# Patient Record
Sex: Male | Born: 1960 | Race: White | Hispanic: No | Marital: Married | Smoking: Never smoker
Health system: Southern US, Community
[De-identification: ages and names within clinical notes are randomized; demographics above are authoritative.]

## PROBLEM LIST (undated history)

## (undated) DIAGNOSIS — M1990 Unspecified osteoarthritis, unspecified site: Secondary | ICD-10-CM

## (undated) DIAGNOSIS — Z973 Presence of spectacles and contact lenses: Secondary | ICD-10-CM

## (undated) DIAGNOSIS — T8859XA Other complications of anesthesia, initial encounter: Secondary | ICD-10-CM

## (undated) DIAGNOSIS — T4145XA Adverse effect of unspecified anesthetic, initial encounter: Secondary | ICD-10-CM

## (undated) DIAGNOSIS — Z87442 Personal history of urinary calculi: Secondary | ICD-10-CM

## (undated) HISTORY — PX: CARPAL TUNNEL RELEASE: SHX101

## (undated) HISTORY — PX: WISDOM TOOTH EXTRACTION: SHX21

## (undated) HISTORY — PX: TOTAL HIP ARTHROPLASTY: SHX124

---

## 2015-01-22 ENCOUNTER — Ambulatory Visit: Payer: Self-pay | Admitting: Orthopedic Surgery

## 2015-01-30 ENCOUNTER — Ambulatory Visit: Payer: Self-pay | Admitting: Orthopedic Surgery

## 2015-01-30 NOTE — H&P (Signed)
TOTAL HIP REVISION ADMISSION H&P  Patient is admitted for right revision total hip arthroplasty.  Subjective:  Chief Complaint: right hip pain  HPI: Larry Brewer, 54 y.o. male, has a history of pain and functional disability in the right hip due to failed right total hip replacement.  The indications for the revision total hip arthroplasty are fracture or mechanical failure of one or more component.  Onset of symptoms was abrupt starting 1 years ago with rapidlly worsening course since that time.  Prior procedures on the right hip include arthroplasty.  Patient currently rates pain in the right hip at 10 out of 10 with activity.  This condition presents safety issues increasing the risk of falls. There is no current active infection.  There are no active problems to display for this patient.  No past medical history on file.  No past surgical history on file.   (Not in a hospital admission) Allergies  Allergen Reactions  . Etodolac Other (See Comments)    Visual interaction. "wierdest eye distortion"  . Avelox [Moxifloxacin Hcl In Nacl] Rash    Across chest.    Social History  Substance Use Topics  . Smoking status: Not on file  . Smokeless tobacco: Not on file  . Alcohol Use: Not on file    No family history on file.    Review of Systems  Constitutional: Negative.   HENT: Positive for tinnitus.   Eyes: Negative.   Respiratory: Negative.   Cardiovascular: Negative.   Gastrointestinal: Negative.   Genitourinary: Negative.   Musculoskeletal: Negative.   Skin: Negative.   Neurological: Negative.   Endo/Heme/Allergies: Negative.   Psychiatric/Behavioral: Negative.     Objective:  Physical Exam  Vitals reviewed. Constitutional: He is oriented to person, place, and time. He appears well-developed and well-nourished.  HENT:  Head: Normocephalic.  Eyes: Conjunctivae and EOM are normal. Pupils are equal, round, and reactive to light.  Neck: Normal range of motion. Neck  supple.  Cardiovascular: Normal rate and regular rhythm.   Respiratory: Effort normal and breath sounds normal. No respiratory distress.  GI: Soft. Bowel sounds are normal. He exhibits no distension.  Genitourinary:  deferred  Musculoskeletal:       Right hip: He exhibits decreased range of motion and crepitus.  Neurological: He is alert and oriented to person, place, and time. He has normal reflexes.  Skin: Skin is warm and dry.  Psychiatric: He has a normal mood and affect. His behavior is normal. Judgment and thought content normal.    Vital signs in last 24 hours: @VSRANGES@   Labs:   There is no height or weight on file to calculate BMI.  Imaging Review:  Plain radiographs demonstrate fractured ceramic liner. The bone quality appears to be adequate for age and reported activity level.  Assessment/Plan:  End stage arthritis, right hip(s) with failed previous arthroplasty.  The patient history, physical examination, clinical judgement of the provider and imaging studies are consistent with end stage degenerative joint disease of the right hip(s), previous total hip arthroplasty. Revision total hip arthroplasty is deemed medically necessary. The treatment options including medical management, injection therapy, arthroscopy and arthroplasty were discussed at length. The risks and benefits of total hip arthroplasty were presented and reviewed. The risks due to aseptic loosening, infection, stiffness, dislocation/subluxation,  thromboembolic complications and other imponderables were discussed.  The patient acknowledged the explanation, agreed to proceed with the plan and consent was signed. Patient is being admitted for inpatient treatment for surgery, pain control,   PT, OT, prophylactic antibiotics, VTE prophylaxis, progressive ambulation and ADL's and discharge planning. The patient is planning to be discharged home with home health services

## 2015-01-31 ENCOUNTER — Encounter (HOSPITAL_COMMUNITY)
Admission: RE | Admit: 2015-01-31 | Discharge: 2015-01-31 | Disposition: A | Discharge status: Home / Self Care | Payer: BLUE CROSS/BLUE SHIELD | Source: Ambulatory Visit | Attending: Orthopedic Surgery | Admitting: Orthopedic Surgery

## 2015-01-31 ENCOUNTER — Encounter (HOSPITAL_COMMUNITY): Payer: Self-pay

## 2015-01-31 DIAGNOSIS — Z01812 Encounter for preprocedural laboratory examination: Secondary | ICD-10-CM | POA: Insufficient documentation

## 2015-01-31 HISTORY — DX: Other complications of anesthesia, initial encounter: T88.59XA

## 2015-01-31 HISTORY — DX: Adverse effect of unspecified anesthetic, initial encounter: T41.45XA

## 2015-01-31 HISTORY — DX: Personal history of urinary calculi: Z87.442

## 2015-01-31 HISTORY — DX: Presence of spectacles and contact lenses: Z97.3

## 2015-01-31 HISTORY — DX: Unspecified osteoarthritis, unspecified site: M19.90

## 2015-01-31 LAB — URINALYSIS, ROUTINE W REFLEX MICROSCOPIC
Bilirubin Urine: NEGATIVE
GLUCOSE, UA: NEGATIVE mg/dL
Hgb urine dipstick: NEGATIVE
KETONES UR: NEGATIVE mg/dL
LEUKOCYTES UA: NEGATIVE
NITRITE: NEGATIVE
PROTEIN: NEGATIVE mg/dL
Specific Gravity, Urine: 1.009 (ref 1.005–1.030)
pH: 6.5 (ref 5.0–8.0)

## 2015-01-31 LAB — COMPREHENSIVE METABOLIC PANEL
ALK PHOS: 86 U/L (ref 38–126)
ALT: 39 U/L (ref 17–63)
AST: 29 U/L (ref 15–41)
Albumin: 4.7 g/dL (ref 3.5–5.0)
Anion gap: 9 (ref 5–15)
BUN: 14 mg/dL (ref 6–20)
CALCIUM: 9.6 mg/dL (ref 8.9–10.3)
CHLORIDE: 106 mmol/L (ref 101–111)
CO2: 26 mmol/L (ref 22–32)
CREATININE: 0.95 mg/dL (ref 0.61–1.24)
GFR calc Af Amer: >60 mL/min (ref >60)
GFR calc non Af Amer: >60 mL/min (ref >60)
Glucose, Bld: 84 mg/dL (ref 65–99)
Potassium: 4 mmol/L (ref 3.5–5.1)
SODIUM: 141 mmol/L (ref 135–145)
Total Bilirubin: 1.3 mg/dL — ABNORMAL HIGH (ref 0.3–1.2)
Total Protein: 7.4 g/dL (ref 6.5–8.1)

## 2015-01-31 LAB — PROTIME-INR
INR: 1.06 (ref 0.00–1.49)
Prothrombin Time: 13.6 seconds (ref 11.6–15.2)

## 2015-01-31 LAB — APTT: aPTT: 27 seconds (ref 24–37)

## 2015-01-31 LAB — SURGICAL PCR SCREEN
MRSA, PCR: NEGATIVE
STAPHYLOCOCCUS AUREUS: NEGATIVE

## 2015-01-31 LAB — ABO/RH: ABO/RH(D): B POS

## 2015-01-31 LAB — CBC
HCT: 43.1 % (ref 39.0–52.0)
HEMOGLOBIN: 15.1 g/dL (ref 13.0–17.0)
MCH: 30 pg (ref 26.0–34.0)
MCHC: 35 g/dL (ref 30.0–36.0)
MCV: 85.7 fL (ref 78.0–100.0)
PLATELETS: 147 10*3/uL — AB (ref 150–400)
RBC: 5.03 MIL/uL (ref 4.22–5.81)
RDW: 12.8 % (ref 11.5–15.5)
WBC: 5.8 10*3/uL (ref 4.0–10.5)

## 2015-01-31 NOTE — Patient Instructions (Signed)
Larry Brewer  01/31/2015   Your procedure is scheduled on: Friday February 08, 2015  Report to [facility]  Entrance take [place]  elevators to 3rd floor to  Short Stay Center at 5:30 AM.  Call this number if you have problems the morning of surgery [phone]   Remember: ONLY 1 PERSON MAY GO WITH YOU TO SHORT STAY TO GET  READY MORNING OF YOUR SURGERY.  Do not eat food or drink liquids :After Midnight.     Take these medicines the morning of surgery with A SIP OF WATER: NONE                               You may not have any metal on your body including hair pins and              piercings  Do not wear jewelry, lotions, powders or colognes, deodorant                           Men may shave face and neck.   Do not bring valuables to the hospital. [facility] IS NOT             RESPONSIBLE   FOR VALUABLES.  Contacts, dentures or bridgework may not be worn into surgery.  Leave suitcase in the car. After surgery it may be brought to your room.                Please read over the following fact sheets you were given:INCENTIVE SPIROMETER; BLOOD TRANSFUSION INFORMATION SHEET _____________________________________________________________________             [facility] - Preparing for Surgery Before surgery, you can play an important role.  Because skin is not sterile, your skin needs to be as free of germs as possible.  You can reduce the number of germs on your skin by washing with CHG (chlorahexidine gluconate) soap before surgery.  CHG is an antiseptic cleaner which kills germs and bonds with the skin to continue killing germs even after washing. Please DO NOT use if you have an allergy to CHG or antibacterial soaps.  If your skin becomes reddened/irritated stop using the CHG and inform your nurse when you arrive at Short Stay. Do not shave (including legs and underarms) for at least 48 hours prior to the first CHG shower.  You may shave your face/neck. Please  follow these instructions carefully:  1.  Shower with CHG Soap the night before surgery and the  morning of Surgery.  2.  If you choose to wash your hair, wash your hair first as usual with your  normal  shampoo.  3.  After you shampoo, rinse your hair and body thoroughly to remove the  shampoo.                           4.  Use CHG as you would any other liquid soap.  You can apply chg directly  to the skin and wash                       Gently with a scrungie or clean washcloth.  5.  Apply the CHG Soap to your body ONLY FROM THE NECK DOWN.   Do  not use on face/ open                           Wound or open sores. Avoid contact with eyes, ears mouth and genitals (private parts).                       Wash face,  Genitals (private parts) with your normal soap.             6.  Wash thoroughly, paying special attention to the area where your surgery  will be performed.  7.  Thoroughly rinse your body with warm water from the neck down.  8.  DO NOT shower/wash with your normal soap after using and rinsing off  the CHG Soap.                9.  Pat yourself dry with a clean towel.            10.  Wear clean pajamas.            11.  Place clean sheets on your bed the night of your first shower and do not  sleep with pets. Day of Surgery : Do not apply any lotions/deodorants the morning of surgery.  Please wear clean clothes to the hospital/surgery center.  FAILURE TO FOLLOW THESE INSTRUCTIONS MAY RESULT IN THE CANCELLATION OF YOUR SURGERY PATIENT SIGNATURE_________________________________  NURSE SIGNATURE__________________________________  ________________________________________________________________________   Rogelia Mire  An incentive spirometer is a tool that can help keep your lungs clear and active. This tool measures how well you are filling your lungs with each breath. Taking long deep breaths may help reverse or decrease the chance of developing breathing (pulmonary) problems  (especially infection) following:  A long period of time when you are unable to move or be active. BEFORE THE PROCEDURE   If the spirometer includes an indicator to show your best effort, your nurse or respiratory therapist will set it to a desired goal.  If possible, sit up straight or lean slightly forward. Try not to slouch.  Hold the incentive spirometer in an upright position. INSTRUCTIONS FOR USE   Sit on the edge of your bed if possible, or sit up as far as you can in bed or on a chair.  Hold the incentive spirometer in an upright position.  Breathe out normally.  Place the mouthpiece in your mouth and seal your lips tightly around it.  Breathe in slowly and as deeply as possible, raising the piston or the ball toward the top of the column.  Hold your breath for 3-5 seconds or for as long as possible. Allow the piston or ball to fall to the bottom of the column.  Remove the mouthpiece from your mouth and breathe out normally.  Rest for a few seconds and repeat Steps 1 through 7 at least 10 times every 1-2 hours when you are awake. Take your time and take a few normal breaths between deep breaths.  The spirometer may include an indicator to show your best effort. Use the indicator as a goal to work toward during each repetition.  After each set of 10 deep breaths, practice coughing to be sure your lungs are clear. If you have an incision (the cut made at the time of surgery), support your incision when coughing by placing a pillow or rolled up towels firmly against it. Once you are able to get out of bed,  walk around indoors and cough well. You may stop using the incentive spirometer when instructed by your caregiver.  RISKS AND COMPLICATIONS  Take your time so you do not get dizzy or light-headed.  If you are in pain, you may need to take or ask for pain medication before doing incentive spirometry. It is harder to take a deep breath if you are having pain. AFTER  USE  Rest and breathe slowly and easily.  It can be helpful to keep track of a log of your progress. Your caregiver can provide you with a simple table to help with this. If you are using the spirometer at home, follow these instructions: [place] IF:   You are having difficultly using the spirometer.  You have trouble using the spirometer as often as instructed.  Your pain medication is not giving enough relief while using the spirometer.  You develop fever of 100.5 F (38.1 C) or higher. SEEK IMMEDIATE MEDICAL CARE IF:   You cough up bloody sputum that had not been present before.  You develop fever of 102 F (38.9 C) or greater.  You develop worsening pain at or near the incision site. MAKE SURE YOU:   Understand these instructions.  Will watch your condition.  Will get help right away if you are not doing well or get worse. Document Released: 05/04/2006 Document Revised: 03/16/2011 Document Reviewed: 07/05/2006 Exit[employer] Patient Information 2014 [employer], [place].   ________________________________________________________________________  WHAT IS A BLOOD TRANSFUSION? Blood Transfusion Information  A transfusion is the replacement of blood or some of its parts. Blood is made up of multiple cells which provide different functions.  Red blood cells carry oxygen and are used for blood loss replacement.  White blood cells fight against infection.  Platelets control bleeding.  Plasma helps clot blood.  Other blood products are available for specialized needs, such as hemophilia or other clotting disorders. BEFORE THE TRANSFUSION  Who gives blood for transfusions?   Healthy volunteers who are fully evaluated to make sure their blood is safe. This is blood bank blood. Transfusion therapy is the safest it has ever been in the practice of medicine. Before blood is taken from a donor, a complete history is taken to make sure that person has no history of diseases  nor engages in risky social behavior (examples are intravenous drug use or sexual activity with multiple partners). The donor's travel history is screened to minimize risk of transmitting infections, such as malaria. The donated blood is tested for signs of infectious diseases, such as HIV and hepatitis. The blood is then tested to be sure it is compatible with you in order to minimize the chance of a transfusion reaction. If you or a relative donates blood, this is often done in anticipation of surgery and is not appropriate for emergency situations. It takes many days to process the donated blood. RISKS AND COMPLICATIONS Although transfusion therapy is very safe and saves many lives, the main dangers of transfusion include:   Getting an infectious disease.  Developing a transfusion reaction. This is an allergic reaction to something in the blood you were given. Every precaution is taken to prevent this. The decision to have a blood transfusion has been considered carefully by your caregiver before blood is given. Blood is not given unless the benefits outweigh the risks. AFTER THE TRANSFUSION  Right after receiving a blood transfusion, you will usually feel much better and more energetic. This is especially true if your red blood cells  have gotten low (anemic). The transfusion raises the level of the red blood cells which carry oxygen, and this usually causes an energy increase.  The nurse administering the transfusion will monitor you carefully for complications. HOME CARE INSTRUCTIONS  No special instructions are needed after a transfusion. You may find your energy is better. Speak with your caregiver about any limitations on activity for underlying diseases you may have. SEEK MEDICAL CARE IF:   Your condition is not improving after your transfusion.  You develop redness or irritation at the intravenous (IV) site. SEEK IMMEDIATE MEDICAL CARE IF:  Any of the following symptoms occur over the  next 12 hours:  Shaking chills.  You have a temperature by mouth above 102 F (38.9 C), not controlled by medicine.  Chest, back, or muscle pain.  People around you feel you are not acting correctly or are confused.  Shortness of breath or difficulty breathing.  Dizziness and fainting.  You get a rash or develop hives.  You have a decrease in urine output.  Your urine turns a dark color or changes to pink, red, or brown. Any of the following symptoms occur over the next 10 days:  You have a temperature by mouth above 102 F (38.9 C), not controlled by medicine.  Shortness of breath.  Weakness after normal activity.  The white part of the eye turns yellow (jaundice).  You have a decrease in the amount of urine or are urinating less often.  Your urine turns a dark color or changes to pink, red, or brown. Document Released: 12/20/1999 Document Revised: 03/16/2011 Document Reviewed: 08/08/2007 [facility] Patient Information 2014 [place], [place].  _______________________________________________________________________

## 2015-01-31 NOTE — Progress Notes (Signed)
Clearance note per chart per Dr Burna Mortimer 01/18/2015  ECHO results per chart 11/16/2013 Stress test per chart 11/2013

## 2015-02-01 NOTE — Progress Notes (Addendum)
EKG was received per fax but unable to read and pts name was not identified. Did not preform new EKG due to pt not falling under historical need. ECHO and Stress reports per chart.

## 2015-02-07 NOTE — Anesthesia Preprocedure Evaluation (Addendum)
Anesthesia Evaluation  Patient identified by MRN, date of birth, ID band Patient awake    Reviewed: Allergy & Precautions, NPO status , Patient's Chart, lab work & pertinent test results  History of Anesthesia Complications (+) history of anesthetic complications (HA after anesthestic 40 years ago)  Airway Mallampati: II  TM Distance: >3 FB Neck ROM: Full    Dental  (+) Teeth Intact, Dental Advisory Given   Pulmonary neg pulmonary ROS,    Pulmonary exam normal breath sounds clear to auscultation       Cardiovascular Exercise Tolerance: Good (-) hypertension(-) CAD and (-) Past MI negative cardio ROS Normal cardiovascular exam Rhythm:Regular Rate:Normal     Neuro/Psych negative neurological ROS  negative psych ROS   GI/Hepatic negative GI ROS, Neg liver ROS,   Endo/Other  negative endocrine ROS  Renal/GU negative Renal ROS     Musculoskeletal  (+) Arthritis , Osteoarthritis,    Abdominal   Peds  Hematology Plt 147k on 01/31/15   Anesthesia Other Findings Day of surgery medications reviewed with the patient.  Reproductive/Obstetrics                            Anesthesia Physical Anesthesia Plan  ASA: II  Anesthesia Plan: General   Post-op Pain Management:    Induction: Intravenous  Airway Management Planned: Oral ETT  Additional Equipment:   Intra-op Plan:   Post-operative Plan: Extubation in OR  Informed Consent: I have reviewed the patients History and Physical, chart, labs and discussed the procedure including the risks, benefits and alternatives for the proposed anesthesia with the patient or authorized representative who has indicated his/her understanding and acceptance.   Dental advisory given  Plan Discussed with: CRNA  Anesthesia Plan Comments: (Risks/benefits of general anesthesia discussed with patient including risk of damage to teeth, lips, gum, and tongue,  nausea/vomiting, allergic reactions to medications, and the possibility of heart attack, stroke and death.  All patient questions answered.  Patient wishes to proceed.)        Anesthesia Quick Evaluation

## 2015-02-08 ENCOUNTER — Inpatient Hospital Stay (HOSPITAL_COMMUNITY): Payer: BLUE CROSS/BLUE SHIELD

## 2015-02-08 ENCOUNTER — Encounter (HOSPITAL_COMMUNITY)
Admission: RE | Disposition: A | Discharge status: Home Health | Payer: Self-pay | Source: Ambulatory Visit | Attending: Orthopedic Surgery

## 2015-02-08 ENCOUNTER — Encounter (HOSPITAL_COMMUNITY): Payer: Self-pay | Admitting: *Deleted

## 2015-02-08 ENCOUNTER — Inpatient Hospital Stay (HOSPITAL_COMMUNITY): Payer: BLUE CROSS/BLUE SHIELD | Admitting: Anesthesiology

## 2015-02-08 ENCOUNTER — Inpatient Hospital Stay (HOSPITAL_COMMUNITY)
Admission: RE | Admit: 2015-02-08 | Discharge: 2015-02-09 | DRG: 468 | Disposition: A | Discharge status: Home Health | Payer: BLUE CROSS/BLUE SHIELD | Source: Ambulatory Visit | Attending: Orthopedic Surgery | Admitting: Orthopedic Surgery

## 2015-02-08 DIAGNOSIS — Z87442 Personal history of urinary calculi: Secondary | ICD-10-CM | POA: Diagnosis not present

## 2015-02-08 DIAGNOSIS — T84090A Other mechanical complication of internal right hip prosthesis, initial encounter: Secondary | ICD-10-CM | POA: Diagnosis present

## 2015-02-08 DIAGNOSIS — Z09 Encounter for follow-up examination after completed treatment for conditions other than malignant neoplasm: Secondary | ICD-10-CM

## 2015-02-08 DIAGNOSIS — M25551 Pain in right hip: Secondary | ICD-10-CM | POA: Diagnosis present

## 2015-02-08 DIAGNOSIS — Z96649 Presence of unspecified artificial hip joint: Secondary | ICD-10-CM

## 2015-02-08 DIAGNOSIS — Z01812 Encounter for preprocedural laboratory examination: Secondary | ICD-10-CM

## 2015-02-08 DIAGNOSIS — Y792 Prosthetic and other implants, materials and accessory orthopedic devices associated with adverse incidents: Secondary | ICD-10-CM | POA: Diagnosis present

## 2015-02-08 DIAGNOSIS — M1611 Unilateral primary osteoarthritis, right hip: Secondary | ICD-10-CM | POA: Diagnosis present

## 2015-02-08 DIAGNOSIS — Z888 Allergy status to other drugs, medicaments and biological substances status: Secondary | ICD-10-CM

## 2015-02-08 DIAGNOSIS — T84018A Broken internal joint prosthesis, other site, initial encounter: Secondary | ICD-10-CM

## 2015-02-08 HISTORY — PX: TOTAL HIP REVISION: SHX763

## 2015-02-08 LAB — TYPE AND SCREEN
ABO/RH(D): B POS
Antibody Screen: NEGATIVE

## 2015-02-08 SURGERY — TOTAL HIP REVISION
Anesthesia: General | Site: Hip | Laterality: Right

## 2015-02-08 MED ORDER — PHENOL 1.4 % MT LIQD: 1.0000 | OROMUCOSAL | Status: DC | PRN

## 2015-02-08 MED ORDER — ONDANSETRON HCL 4 MG PO TABS
4.0000 mg | ORAL_TABLET | Freq: Four times a day (QID) | ORAL | Status: DC | PRN
Start: 2015-02-08 — End: 2015-02-09

## 2015-02-08 MED ORDER — BUPIVACAINE-EPINEPHRINE (PF) 0.25% -1:200000 IJ SOLN
INTRAMUSCULAR | Status: AC
  Filled 2015-02-08: qty 30

## 2015-02-08 MED ORDER — SODIUM CHLORIDE 0.9 % IV SOLN: INTRAVENOUS | Status: DC

## 2015-02-08 MED ORDER — GLYCOPYRROLATE 0.2 MG/ML IJ SOLN
INTRAMUSCULAR | Status: DC | PRN
  Administered 2015-02-08: 0.6 mg via INTRAVENOUS

## 2015-02-08 MED ORDER — SENNA 8.6 MG PO TABS: 2.0000 | ORAL_TABLET | Freq: Every day | ORAL | Status: AC

## 2015-02-08 MED ORDER — ISOPROPYL ALCOHOL 70 % SOLN
Status: DC | PRN
  Administered 2015-02-08: 1 via TOPICAL

## 2015-02-08 MED ORDER — LACTATED RINGERS IV SOLN
INTRAVENOUS | Status: DC | PRN
  Administered 2015-02-08 (×2): via INTRAVENOUS

## 2015-02-08 MED ORDER — VANCOMYCIN HCL 1000 MG IV SOLR
1000.0000 mg | Freq: Once | INTRAVENOUS | Status: AC
  Administered 2015-02-08: 1000 mg via INTRAVENOUS
  Filled 2015-02-08: qty 1000

## 2015-02-08 MED ORDER — SENNA 8.6 MG PO TABS
2.0000 | ORAL_TABLET | Freq: Every day | ORAL | Status: DC
  Administered 2015-02-08: 17.2 mg via ORAL

## 2015-02-08 MED ORDER — ONDANSETRON HCL 4 MG/2ML IJ SOLN
INTRAMUSCULAR | Status: AC
  Filled 2015-02-08: qty 2

## 2015-02-08 MED ORDER — DEXAMETHASONE SODIUM PHOSPHATE 10 MG/ML IJ SOLN
10.0000 mg | Freq: Once | INTRAMUSCULAR | Status: AC
  Administered 2015-02-09: 10 mg via INTRAVENOUS
  Filled 2015-02-08: qty 1

## 2015-02-08 MED ORDER — LIDOCAINE HCL (CARDIAC) 20 MG/ML IV SOLN
INTRAVENOUS | Status: AC
  Filled 2015-02-08: qty 5

## 2015-02-08 MED ORDER — KETOROLAC TROMETHAMINE 30 MG/ML IJ SOLN
INTRAMUSCULAR | Status: DC | PRN
  Administered 2015-02-08: 30 mg

## 2015-02-08 MED ORDER — PROPOFOL 10 MG/ML IV BOLUS
INTRAVENOUS | Status: AC
  Filled 2015-02-08: qty 40

## 2015-02-08 MED ORDER — GLYCOPYRROLATE 0.2 MG/ML IJ SOLN
INTRAMUSCULAR | Status: AC
  Filled 2015-02-08: qty 3

## 2015-02-08 MED ORDER — HYDROMORPHONE HCL 1 MG/ML IJ SOLN
0.5000 mg | INTRAMUSCULAR | Status: DC | PRN
Start: 2015-02-08 — End: 2015-02-09

## 2015-02-08 MED ORDER — ATORVASTATIN CALCIUM 10 MG PO TABS
10.0000 mg | ORAL_TABLET | Freq: Every day | ORAL | Status: DC
  Administered 2015-02-08 – 2015-02-09 (×2): 10 mg via ORAL
  Filled 2015-02-08 (×2): qty 1

## 2015-02-08 MED ORDER — PROMETHAZINE HCL 25 MG/ML IJ SOLN: 6.2500 mg | INTRAMUSCULAR | Status: DC | PRN

## 2015-02-08 MED ORDER — LABETALOL HCL 5 MG/ML IV SOLN
INTRAVENOUS | Status: AC
  Filled 2015-02-08: qty 4

## 2015-02-08 MED ORDER — CEFAZOLIN SODIUM-DEXTROSE 2-3 GM-% IV SOLR
2.0000 g | Freq: Four times a day (QID) | INTRAVENOUS | Status: AC
  Administered 2015-02-08 (×2): 2 g via INTRAVENOUS
  Filled 2015-02-08 (×2): qty 50

## 2015-02-08 MED ORDER — FENTANYL CITRATE (PF) 250 MCG/5ML IJ SOLN
INTRAMUSCULAR | Status: DC | PRN
  Administered 2015-02-08 (×2): 50 ug via INTRAVENOUS
  Administered 2015-02-08: 100 ug via INTRAVENOUS

## 2015-02-08 MED ORDER — CHLORHEXIDINE GLUCONATE 4 % EX LIQD: 60.0000 mL | Freq: Once | CUTANEOUS | Status: DC

## 2015-02-08 MED ORDER — FENTANYL CITRATE (PF) 100 MCG/2ML IJ SOLN
INTRAMUSCULAR | Status: DC
Start: 2015-02-08 — End: 2015-02-08
  Filled 2015-02-08: qty 2

## 2015-02-08 MED ORDER — ROCURONIUM BROMIDE 100 MG/10ML IV SOLN
INTRAVENOUS | Status: DC | PRN
  Administered 2015-02-08 (×3): 20 mg via INTRAVENOUS
  Administered 2015-02-08: 50 mg via INTRAVENOUS

## 2015-02-08 MED ORDER — SODIUM CHLORIDE 0.9 % IJ SOLN
INTRAMUSCULAR | Status: DC | PRN
  Administered 2015-02-08: 50 mL

## 2015-02-08 MED ORDER — HYDROCODONE-ACETAMINOPHEN 5-325 MG PO TABS: 1.0000 | ORAL_TABLET | ORAL | Status: AC | PRN

## 2015-02-08 MED ORDER — SODIUM CHLORIDE 0.9 % IV SOLN
INTRAVENOUS | Status: DC
  Administered 2015-02-08: 14:00:00 via INTRAVENOUS

## 2015-02-08 MED ORDER — SODIUM CHLORIDE 0.9 % IR SOLN
Status: DC | PRN
  Administered 2015-02-08 (×2): 1000 mL

## 2015-02-08 MED ORDER — DOCUSATE SODIUM 100 MG PO CAPS
100.0000 mg | ORAL_CAPSULE | Freq: Two times a day (BID) | ORAL | Status: DC
  Administered 2015-02-08 – 2015-02-09 (×2): 100 mg via ORAL

## 2015-02-08 MED ORDER — TRANEXAMIC ACID 1000 MG/10ML IV SOLN
1000.0000 mg | INTRAVENOUS | Status: AC
  Administered 2015-02-08: 1000 mg via INTRAVENOUS
  Filled 2015-02-08: qty 10

## 2015-02-08 MED ORDER — CICLOPIROX OLAMINE 0.77 % EX CREA: 1.0000 "application " | TOPICAL_CREAM | Freq: Two times a day (BID) | CUTANEOUS | Status: DC

## 2015-02-08 MED ORDER — MENTHOL 3 MG MT LOZG: 1.0000 | LOZENGE | OROMUCOSAL | Status: DC | PRN

## 2015-02-08 MED ORDER — DEXTROSE 5 % IV SOLN
500.0000 mg | Freq: Four times a day (QID) | INTRAVENOUS | Status: DC | PRN
  Filled 2015-02-08: qty 5

## 2015-02-08 MED ORDER — KETOROLAC TROMETHAMINE 30 MG/ML IJ SOLN
INTRAMUSCULAR | Status: AC
Start: 2015-02-08 — End: 2015-02-08
  Filled 2015-02-08: qty 1

## 2015-02-08 MED ORDER — ASPIRIN EC 325 MG PO TBEC: 325.0000 mg | DELAYED_RELEASE_TABLET | Freq: Two times a day (BID) | ORAL | Status: AC

## 2015-02-08 MED ORDER — ASPIRIN EC 325 MG PO TBEC
325.0000 mg | DELAYED_RELEASE_TABLET | Freq: Two times a day (BID) | ORAL | Status: DC
  Administered 2015-02-09: 325 mg via ORAL
  Filled 2015-02-08 (×3): qty 1

## 2015-02-08 MED ORDER — ONDANSETRON HCL 4 MG/2ML IJ SOLN
4.0000 mg | Freq: Four times a day (QID) | INTRAMUSCULAR | Status: DC | PRN
  Administered 2015-02-08: 4 mg via INTRAVENOUS
  Filled 2015-02-08: qty 2

## 2015-02-08 MED ORDER — ACETAMINOPHEN 325 MG PO TABS: 650.0000 mg | ORAL_TABLET | Freq: Four times a day (QID) | ORAL | Status: DC | PRN

## 2015-02-08 MED ORDER — ROCURONIUM BROMIDE 100 MG/10ML IV SOLN
INTRAVENOUS | Status: AC
  Filled 2015-02-08: qty 1

## 2015-02-08 MED ORDER — PHENYLEPHRINE 40 MCG/ML (10ML) SYRINGE FOR IV PUSH (FOR BLOOD PRESSURE SUPPORT)
PREFILLED_SYRINGE | INTRAVENOUS | Status: AC
  Filled 2015-02-08: qty 10

## 2015-02-08 MED ORDER — FENTANYL CITRATE (PF) 250 MCG/5ML IJ SOLN
INTRAMUSCULAR | Status: AC
  Filled 2015-02-08: qty 5

## 2015-02-08 MED ORDER — NEOSTIGMINE METHYLSULFATE 10 MG/10ML IV SOLN
INTRAVENOUS | Status: AC
Start: 2015-02-08 — End: 2015-02-08
  Filled 2015-02-08: qty 1

## 2015-02-08 MED ORDER — FENTANYL CITRATE (PF) 100 MCG/2ML IJ SOLN: 25.0000 ug | INTRAMUSCULAR | Status: DC | PRN

## 2015-02-08 MED ORDER — SODIUM CHLORIDE 0.9 % IJ SOLN
INTRAMUSCULAR | Status: AC
  Filled 2015-02-08: qty 50

## 2015-02-08 MED ORDER — CEFAZOLIN SODIUM-DEXTROSE 2-3 GM-% IV SOLR
INTRAVENOUS | Status: AC
  Filled 2015-02-08: qty 50

## 2015-02-08 MED ORDER — DEXAMETHASONE SODIUM PHOSPHATE 10 MG/ML IJ SOLN
INTRAMUSCULAR | Status: DC | PRN
  Administered 2015-02-08: 10 mg via INTRAVENOUS

## 2015-02-08 MED ORDER — LIDOCAINE HCL (CARDIAC) 20 MG/ML IV SOLN
INTRAVENOUS | Status: DC | PRN
  Administered 2015-02-08: 50 mg via INTRATRACHEAL

## 2015-02-08 MED ORDER — METOCLOPRAMIDE HCL 5 MG/ML IJ SOLN: 5.0000 mg | Freq: Three times a day (TID) | INTRAMUSCULAR | Status: DC | PRN

## 2015-02-08 MED ORDER — METHOCARBAMOL 500 MG PO TABS
500.0000 mg | ORAL_TABLET | Freq: Four times a day (QID) | ORAL | Status: DC | PRN
  Administered 2015-02-09: 500 mg via ORAL
  Filled 2015-02-08: qty 1

## 2015-02-08 MED ORDER — BUPIVACAINE-EPINEPHRINE 0.25% -1:200000 IJ SOLN
INTRAMUSCULAR | Status: DC | PRN
  Administered 2015-02-08: 30 mL

## 2015-02-08 MED ORDER — WATER FOR IRRIGATION, STERILE IR SOLN
Status: DC | PRN
  Administered 2015-02-08: 1000 mL

## 2015-02-08 MED ORDER — HYDROCODONE-ACETAMINOPHEN 5-325 MG PO TABS
1.0000 | ORAL_TABLET | ORAL | Status: DC | PRN
  Administered 2015-02-08 (×2): 1 via ORAL
  Filled 2015-02-08 (×2): qty 1
  Filled 2015-02-08 (×2): qty 2

## 2015-02-08 MED ORDER — HYDROGEN PEROXIDE 3 % EX SOLN
CUTANEOUS | Status: AC
Start: 2015-02-08 — End: 2015-02-08
  Filled 2015-02-08: qty 473

## 2015-02-08 MED ORDER — DOCUSATE SODIUM 100 MG PO CAPS: 100.0000 mg | ORAL_CAPSULE | Freq: Two times a day (BID) | ORAL | Status: AC

## 2015-02-08 MED ORDER — ACETAMINOPHEN 650 MG RE SUPP: 650.0000 mg | Freq: Four times a day (QID) | RECTAL | Status: DC | PRN

## 2015-02-08 MED ORDER — PROPOFOL 10 MG/ML IV BOLUS
INTRAVENOUS | Status: DC | PRN
  Administered 2015-02-08: 200 mg via INTRAVENOUS

## 2015-02-08 MED ORDER — VANCOMYCIN HCL IN DEXTROSE 1-5 GM/200ML-% IV SOLN
INTRAVENOUS | Status: AC
  Filled 2015-02-08: qty 200

## 2015-02-08 MED ORDER — ONDANSETRON HCL 4 MG PO TABS: 4.0000 mg | ORAL_TABLET | Freq: Three times a day (TID) | ORAL | Status: AC | PRN

## 2015-02-08 MED ORDER — KETOROLAC TROMETHAMINE 30 MG/ML IJ SOLN
INTRAMUSCULAR | Status: DC | PRN
  Administered 2015-02-08: 30 mg via INTRAVENOUS

## 2015-02-08 MED ORDER — NEOSTIGMINE METHYLSULFATE 10 MG/10ML IV SOLN
INTRAVENOUS | Status: DC | PRN
  Administered 2015-02-08: 4 mg via INTRAVENOUS

## 2015-02-08 MED ORDER — METOCLOPRAMIDE HCL 10 MG PO TABS: 5.0000 mg | ORAL_TABLET | Freq: Three times a day (TID) | ORAL | Status: DC | PRN

## 2015-02-08 MED ORDER — HYDROGEN PEROXIDE 3 % EX SOLN
CUTANEOUS | Status: DC | PRN
  Administered 2015-02-08: 1

## 2015-02-08 MED ORDER — MIDAZOLAM HCL 2 MG/2ML IJ SOLN
INTRAMUSCULAR | Status: AC
Start: 2015-02-08 — End: 2015-02-08
  Filled 2015-02-08: qty 2

## 2015-02-08 MED ORDER — DEXAMETHASONE SODIUM PHOSPHATE 10 MG/ML IJ SOLN
INTRAMUSCULAR | Status: AC
  Filled 2015-02-08: qty 1

## 2015-02-08 MED ORDER — MIDAZOLAM HCL 2 MG/2ML IJ SOLN
INTRAMUSCULAR | Status: DC | PRN
  Administered 2015-02-08: 2 mg via INTRAVENOUS

## 2015-02-08 MED ORDER — ISOPROPYL ALCOHOL 70 % SOLN
Status: AC
  Filled 2015-02-08: qty 480

## 2015-02-08 MED ORDER — CEFAZOLIN SODIUM-DEXTROSE 2-3 GM-% IV SOLR
2.0000 g | INTRAVENOUS | Status: AC
  Administered 2015-02-08: 2 g via INTRAVENOUS

## 2015-02-08 SURGICAL SUPPLY — 42 items
BAG DECANTER FOR FLEXI CONT (MISCELLANEOUS) IMPLANT
BAG ZIPLOCK 12X15 (MISCELLANEOUS) IMPLANT
CHLORAPREP W/TINT 26ML (MISCELLANEOUS) ×3 IMPLANT
CLOTH BEACON ORANGE TIMEOUT ST (SAFETY) ×3 IMPLANT
COVER PERINEAL POST (MISCELLANEOUS) ×3 IMPLANT
DECANTER SPIKE VIAL GLASS SM (MISCELLANEOUS) ×3 IMPLANT
DRAPE LG THREE QUARTER DISP (DRAPES) ×6 IMPLANT
DRAPE STERI IOBAN 125X83 (DRAPES) ×3 IMPLANT
DRAPE U-SHAPE 47X51 STRL (DRAPES) ×6 IMPLANT
DRSG AQUACEL AG ADV 3.5X10 (GAUZE/BANDAGES/DRESSINGS) ×3 IMPLANT
ELECT REM PT RETURN 15FT ADLT (MISCELLANEOUS) ×3 IMPLANT
GAUZE SPONGE 4X4 12PLY STRL (GAUZE/BANDAGES/DRESSINGS) ×3 IMPLANT
GLOVE BIO SURGEON STRL SZ8.5 (GLOVE) ×6 IMPLANT
GLOVE BIOGEL PI IND STRL 8.5 (GLOVE) ×1 IMPLANT
GLOVE BIOGEL PI INDICATOR 8.5 (GLOVE) ×2
GOWN SPEC L3 XXLG W/TWL (GOWN DISPOSABLE) ×6 IMPLANT
GOWN STRL REUS W/TWL LRG LVL3 (GOWN DISPOSABLE) ×12 IMPLANT
HANDPIECE INTERPULSE COAX TIP (DISPOSABLE) ×2
HEAD FEM BIOLOX DELTA 36 8.5 (Orthopedic Implant) ×3 IMPLANT
HOLDER FOLEY CATH W/STRAP (MISCELLANEOUS) ×3 IMPLANT
HOOD PEEL AWAY FLYTE STAYCOOL (MISCELLANEOUS) ×6 IMPLANT
LINER PINN ALTRX ACTABR 36X60 (Liner) ×1 IMPLANT
LINER PINNACLE ALTRAX ACTABULR (Liner) ×2 IMPLANT
LIQUID BAND (GAUZE/BANDAGES/DRESSINGS) ×3 IMPLANT
MARKER SKIN DUAL TIP RULER LAB (MISCELLANEOUS) ×3 IMPLANT
NEEDLE SPNL 18GX3.5 QUINCKE PK (NEEDLE) ×3 IMPLANT
PACK ANTERIOR HIP CUSTOM (KITS) ×3 IMPLANT
SAW OSC TIP CART 19.5X105X1.3 (SAW) ×3 IMPLANT
SEALER BIPOLAR AQUA 6.0 (INSTRUMENTS) ×3 IMPLANT
SET HNDPC FAN SPRY TIP SCT (DISPOSABLE) ×1 IMPLANT
SOL PREP POV-IOD 4OZ 10% (MISCELLANEOUS) ×3 IMPLANT
SUT ETHIBOND NAB CT1 #1 30IN (SUTURE) ×6 IMPLANT
SUT MNCRL AB 3-0 PS2 18 (SUTURE) ×3 IMPLANT
SUT MON AB 2-0 CT1 36 (SUTURE) ×6 IMPLANT
SUT VIC AB 1 CT1 36 (SUTURE) ×3 IMPLANT
SUT VIC AB 2-0 CT1 27 (SUTURE) ×2
SUT VIC AB 2-0 CT1 TAPERPNT 27 (SUTURE) ×1 IMPLANT
SUT VLOC 180 0 24IN GS25 (SUTURE) ×3 IMPLANT
SYR 50ML LL SCALE MARK (SYRINGE) ×3 IMPLANT
TRAY FOLEY W/METER SILVER 16FR (SET/KITS/TRAYS/PACK) ×3 IMPLANT
WATER STERILE IRR 1500ML POUR (IV SOLUTION) ×3 IMPLANT
YANKAUER SUCT BULB TIP 10FT TU (MISCELLANEOUS) ×3 IMPLANT

## 2015-02-08 NOTE — Anesthesia Procedure Notes (Signed)
Procedure Name: Intubation Date/Time: 02/08/2015 7:57 AM Performed by: Donna Bernard Pre-anesthesia Checklist: Patient identified, Emergency Drugs available, Suction available, Patient being monitored and Timeout performed Patient Re-evaluated:Patient Re-evaluated prior to inductionOxygen Delivery Method: Circle system utilized Preoxygenation: Pre-oxygenation with 100% oxygen Intubation Type: IV induction Ventilation: Mask ventilation without difficulty Laryngoscope Size: 3 and Mac Grade View: Grade I Tube type: Oral Tube size: 7.5 mm Number of attempts: 1 Airway Equipment and Method: Stylet Placement Confirmation: positive ETCO2,  ETT inserted through vocal cords under direct vision,  CO2 detector and breath sounds checked- equal and bilateral Secured at: 23 cm Tube secured with: Tape Dental Injury: Teeth and Oropharynx as per pre-operative assessment

## 2015-02-08 NOTE — Brief Op Note (Signed)
02/08/2015  11:36 AM  PATIENT:  Larry Brewer  55 y.o. male  PRE-OPERATIVE DIAGNOSIS:  FAILED RIGHT TOTAL HIP ARTHROPLASTY  POST-OPERATIVE DIAGNOSIS:  FAILED RIGHT TOTAL HIP ARTHROPLASTY  PROCEDURE:  Procedure(s):  RIGHT TOTAL HIP REVISION HEAD AND LINER EXCHANGE (Right)  SURGEON:  Surgeon(s) and Role:    * Samson Frederic, MD - Primary  PHYSICIAN ASSISTANT: none  ASSISTANTS: staff   ANESTHESIA:   general  EBL:  Total I/O In: 1200 [I.V.:1200] Out: 445 [Urine:245; Blood:200]  BLOOD ADMINISTERED:none  DRAINS: none   LOCAL MEDICATIONS USED:  MARCAINE     SPECIMEN:  Source of Specimen:  right hip pseudocapsule for culture; right hip implants to pathology  DISPOSITION OF SPECIMEN:  PATHOLOGY  COUNTS:  YES  TOURNIQUET:  * No tourniquets in log *  DICTATION: .Other Dictation: Dictation Number [id number]  PLAN OF CARE: Admit to inpatient   PATIENT DISPOSITION:  PACU - hemodynamically stable.   Delay start of Pharmacological VTE agent (>24hrs) due to surgical blood loss or risk of bleeding: no

## 2015-02-08 NOTE — Discharge Instructions (Signed)
°Dr. Karmyn Lowman °Joint Replacement Specialist °[facility] °[address] °[place], [place] [address] °[phone] ° ° °TOTAL HIP REPLACEMENT POSTOPERATIVE DIRECTIONS ° ° ° °Hip Rehabilitation, Guidelines Following Surgery  ° °WEIGHT BEARING °Weight bearing as tolerated with assist device (walker, cane, etc) as directed, use it as long as suggested by your surgeon or therapist, typically at least 4-6 weeks. ° °The results of a hip operation are greatly improved after range of motion and muscle strengthening exercises. Follow all safety measures which are given to protect your hip. If any of these exercises cause increased pain or swelling in your joint, decrease the amount until you are comfortable again. Then slowly increase the exercises. Call your caregiver if you have problems or questions.  ° °HOME CARE INSTRUCTIONS  °Most of the following instructions are designed to prevent the dislocation of your new hip.  °Remove items at home which could result in a fall. This includes throw rugs or furniture in walking pathways.  °Continue medications as instructed at time of discharge. °· You may have some home medications which will be placed on hold until you complete the course of blood thinner medication. °· You may start showering once you are discharged home. Do not remove your dressing. °Do not put on socks or shoes without following the instructions of your caregivers.   °Sit on chairs with arms. Use the chair arms to help push yourself up when arising.  °Arrange for the use of a toilet seat elevator so you are not sitting low.  °· Walk with walker as instructed.  °You may resume a sexual relationship in one month or when given the OK by your caregiver.  °Use walker as long as suggested by your caregivers.  °You may put full weight on your legs and walk as much as is comfortable. °Avoid periods of inactivity such as sitting longer than an hour when not asleep. This helps prevent  blood clots.  °You may return to work once you are cleared by your surgeon.  °Do not drive a car for 6 weeks or until released by your surgeon.  °Do not drive while taking narcotics.  °Wear elastic stockings for two weeks following surgery during the day but you may remove then at night.  °Make sure you keep all of your appointments after your operation with all of your doctors and caregivers. You should call the office at the above phone number and make an appointment for approximately two weeks after the date of your surgery. °Please pick up a stool softener and laxative for home use as long as you are requiring pain medications. °· ICE to the affected hip every three hours for 30 minutes at a time and then as needed for pain and swelling. Continue to use ice on the hip for pain and swelling from surgery. You may notice swelling that will progress down to the foot and ankle.  This is normal after surgery.  Elevate the leg when you are not up walking on it.   °It is important for you to complete the blood thinner medication as prescribed by your doctor. °· Continue to use the breathing machine which will help keep your temperature down.  It is common for your temperature to cycle up and down following surgery, especially at night when you are not up moving around and exerting yourself.  The breathing machine keeps your lungs expanded and your temperature down. ° °RANGE OF MOTION AND STRENGTHENING EXERCISES  °These exercises are   designed to help you keep full movement of your hip joint. Follow your caregiver's or physical therapist's instructions. Perform all exercises about fifteen times, three times per day or as directed. Exercise both hips, even if you have had only one joint replacement. These exercises can be done on a training (exercise) mat, on the floor, on a table or on a bed. Use whatever works the best and is most comfortable for you. Use music or television while you are exercising so that the exercises  are a pleasant break in your day. This will make your life better with the exercises acting as a break in routine you can look forward to.  °Lying on your back, slowly slide your foot toward your buttocks, raising your knee up off the floor. Then slowly slide your foot back down until your leg is straight again.  °Lying on your back spread your legs as far apart as you can without causing discomfort.  °Lying on your side, raise your upper leg and foot straight up from the floor as far as is comfortable. Slowly lower the leg and repeat.  °Lying on your back, tighten up the muscle in the front of your thigh (quadriceps muscles). You can do this by keeping your leg straight and trying to raise your heel off the floor. This helps strengthen the largest muscle supporting your knee.  °Lying on your back, tighten up the muscles of your buttocks both with the legs straight and with the knee bent at a comfortable angle while keeping your heel on the floor.  ° °SKILLED REHAB INSTRUCTIONS: °If the patient is transferred to a skilled rehab facility following release from the hospital, a list of the current medications will be sent to the facility for the patient to continue.  When discharged from the skilled rehab facility, please have the facility set up the patient's Home Health Physical Therapy prior to being released. Also, the skilled facility will be responsible for providing the patient with their medications at time of release from the facility to include their pain medication and their blood thinner medication. If the patient is still at the rehab facility at time of the two week follow up appointment, the skilled rehab facility will also need to assist the patient in arranging follow up appointment in our office and any transportation needs. ° °MAKE SURE YOU:  °Understand these instructions.  °Will watch your condition.  °Will get help right away if you are not doing well or get worse. ° °Pick up stool softner and  laxative for home use following surgery while on pain medications. °Do not remove your dressing. °The dressing is waterproof--it is OK to take showers. °Continue to use ice for pain and swelling after surgery. °Do not use any lotions or creams on the incision until instructed by your surgeon. °Total Hip Protocol. ° ° °

## 2015-02-08 NOTE — Transfer of Care (Signed)
Immediate Anesthesia Transfer of Care Note  Patient: Larry Brewer  Procedure(s) Performed: Procedure(s):  RIGHT TOTAL HIP REVISION HEAD AND LINER EXCHANGE (Right)  Patient Location: PACU  Anesthesia Type:General  Level of Consciousness: alert   Airway & Oxygen Therapy: Patient connected to face mask oxygen  Post-op Assessment: Post -op Vital signs reviewed and stable  Post vital signs: stable  Last Vitals:  Filed Vitals:   02/08/15 0540  BP: 123/81  Pulse: 56  Temp: 36.2 C  Resp: 18    Complications: No apparent anesthesia complications

## 2015-02-08 NOTE — Evaluation (Signed)
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  Pt should progress well to dc home with family assist and HHPT follow up as needed.    Follow Up Recommendations Home health PT    Equipment Recommendations  Rolling walker with 5" wheels    Recommendations for Other Services OT consult     Precautions / Restrictions Precautions Precautions: Fall Restrictions Weight Bearing Restrictions: No Other Position/Activity Restrictions: WBAT      Mobility  Bed Mobility Overal bed mobility: Needs Assistance Bed Mobility: Supine to Sit     Supine to sit: Min assist     General bed mobility comments: min cues for sequence and use of L LE to self assist  Transfers Overall transfer level: Needs assistance Equipment used: Rolling walker (2 wheeled) Transfers: Sit to/from Stand Sit to Stand: Min assist         General transfer comment: cues for LE management and use of UEs to self assist  Ambulation/Gait Ambulation/Gait assistance: Min guard Ambulation Distance (Feet): 200 Feet Assistive device: Rolling walker (2 wheeled) Gait Pattern/deviations: Step-to pattern;Step-through pattern;Decreased step length - right;Decreased step length - left;Shuffle;Trunk flexed     General Gait Details: min cues for posture and position from [employer]            Wheelchair Mobility    Modified Rankin (Stroke Patients Only)       Balance                                             Pertinent Vitals/Pain Pain Assessment: 0-10 Pain Score: 2  Pain Location: R hip Pain Descriptors / Indicators: Sore Pain Intervention(s): Limited activity within patient's  tolerance;Monitored during session;Premedicated before session;Ice applied    Home Living Family/patient expects to be discharged to:: Private residence Living Arrangements: Spouse/significant other Available Help at Discharge: Family Type of Home: House Home Access: Stairs to enter Entrance Stairs-Rails: None Entrance Stairs-Number of Steps: 1 Home Layout: Two level;Able to live on main level with bedroom/bathroom Home Equipment: None      Prior Function Level of Independence: Independent               Hand Dominance        Extremity/Trunk Assessment   Upper Extremity Assessment: Overall WFL for tasks assessed           Lower Extremity Assessment: RLE deficits/detail      Cervical / Trunk Assessment: Normal  Communication   Communication: No difficulties  Cognition Arousal/Alertness: Awake/alert Behavior During Therapy: WFL for tasks assessed/performed Overall Cognitive Status: Within Functional Limits for tasks assessed                      General Comments      Exercises        Assessment/Plan    PT Assessment Patient needs continued PT services  PT Diagnosis Difficulty walking   PT Problem List Decreased strength;Decreased range of motion;Decreased activity tolerance;Decreased mobility;Decreased knowledge of use of DME;Pain  PT Treatment Interventions DME instruction;Gait training;Stair training;Functional mobility training;Therapeutic activities;Therapeutic exercise;Patient/family education  PT Goals (Current goals can be found in the Care Plan section) Acute Rehab PT Goals Patient Stated Goal: Resume previous lifestyle with decreased pain PT Goal Formulation: With patient Time For Goal Achievement: 02/12/15 Potential to Achieve Goals: Good    Frequency 7X/week   Barriers to discharge        Co-evaluation               End of Session Equipment Utilized During Treatment: Gait belt Activity Tolerance: Patient tolerated  treatment well Patient left: in chair;with call bell/phone within reach;with family/visitor present Nurse Communication: Mobility status         Time: 1700-1733 PT Time Calculation (min) (ACUTE ONLY): 33 min   Charges:   PT Evaluation $PT Eval Low Complexity: 1 Procedure PT Treatments $Gait Training: 8-22 mins   PT G Codes:        Glyndon Tursi 2015-02-27, 6:07 PM

## 2015-02-08 NOTE — Anesthesia Postprocedure Evaluation (Signed)
Anesthesia Post Note  Patient: Larry Brewer  Procedure(s) Performed: Procedure(s) (LRB):  RIGHT TOTAL HIP REVISION HEAD AND LINER EXCHANGE (Right)  Patient location during evaluation: PACU Anesthesia Type: General Level of consciousness: awake and alert Pain management: pain level controlled Vital Signs Assessment: post-procedure vital signs reviewed and stable Respiratory status: spontaneous breathing, nonlabored ventilation, respiratory function stable and patient connected to nasal cannula oxygen Cardiovascular status: blood pressure returned to baseline and stable Postop Assessment: no signs of nausea or vomiting Anesthetic complications: no    Last Vitals:  Filed Vitals:   02/08/15 1334 02/08/15 1437  BP: 116/64 118/74  Pulse: 63 63  Temp: 36.3 C 36.6 C  Resp: 16 16    Last Pain:  Filed Vitals:   02/08/15 1439  PainSc: Asleep                 Cecile Hearing

## 2015-02-08 NOTE — Interval H&P Note (Signed)
History and Physical Interval Note:  02/08/2015 7:36 AM  Larry Brewer  has presented today for surgery, with the diagnosis of FAILED RIGHT TOTAL HIP ARTHROPLASTY  The various methods of treatment have been discussed with the patient and family. After consideration of risks, benefits and other options for treatment, the patient has consented to  Procedure(s): REVISION RIGHT TOTAL HIP REVISION HEAD AND LINER EXCHANGE (Right) as a surgical intervention .  The patient's history has been reviewed, patient examined, no change in status, stable for surgery.  I have reviewed the patient's chart and labs.  Questions were answered to the patient's satisfaction.     Emir Nack, Cloyde Reams

## 2015-02-08 NOTE — H&P (View-Only) (Signed)
TOTAL HIP REVISION ADMISSION H&P  Patient is admitted for right revision total hip arthroplasty.  Subjective:  Chief Complaint: right hip pain  HPI: Larry Brewer, 55 y.o. male, has a history of pain and functional disability in the right hip due to failed right total hip replacement.  The indications for the revision total hip arthroplasty are fracture or mechanical failure of one or more component.  Onset of symptoms was abrupt starting 1 years ago with rapidlly worsening course since that time.  Prior procedures on the right hip include arthroplasty.  Patient currently rates pain in the right hip at 10 out of 10 with activity.  This condition presents safety issues increasing the risk of falls. There is no current active infection.  There are no active problems to display for this patient.  No past medical history on file.  No past surgical history on file.   (Not in a hospital admission) Allergies  Allergen Reactions  . Etodolac Other (See Comments)    Visual interaction. "wierdest eye distortion"  . Avelox [Moxifloxacin Hcl In Nacl] Rash    Across chest.    Social History  Substance Use Topics  . Smoking status: Not on file  . Smokeless tobacco: Not on file  . Alcohol Use: Not on file    No family history on file.    Review of Systems  Constitutional: Negative.   HENT: Positive for tinnitus.   Eyes: Negative.   Respiratory: Negative.   Cardiovascular: Negative.   Gastrointestinal: Negative.   Genitourinary: Negative.   Musculoskeletal: Negative.   Skin: Negative.   Neurological: Negative.   Endo/Heme/Allergies: Negative.   Psychiatric/Behavioral: Negative.     Objective:  Physical Exam  Vitals reviewed. Constitutional: He is oriented to person, place, and time. He appears well-developed and well-nourished.  HENT:  Head: Normocephalic.  Eyes: Conjunctivae and EOM are normal. Pupils are equal, round, and reactive to light.  Neck: Normal range of motion. Neck  supple.  Cardiovascular: Normal rate and regular rhythm.   Respiratory: Effort normal and breath sounds normal. No respiratory distress.  GI: Soft. Bowel sounds are normal. He exhibits no distension.  Genitourinary:  deferred  Musculoskeletal:       Right hip: He exhibits decreased range of motion and crepitus.  Neurological: He is alert and oriented to person, place, and time. He has normal reflexes.  Skin: Skin is warm and dry.  Psychiatric: He has a normal mood and affect. His behavior is normal. Judgment and thought content normal.    Vital signs in last 24 hours: @   Labs:   There is no height or weight on file to calculate BMI.  Imaging Review:  Plain radiographs demonstrate fractured ceramic liner. The bone quality appears to be adequate for age and reported activity level.  Assessment/Plan:  End stage arthritis, right hip(s) with failed previous arthroplasty.  The patient history, physical examination, clinical judgement of the provider and imaging studies are consistent with end stage degenerative joint disease of the right hip(s), previous total hip arthroplasty. Revision total hip arthroplasty is deemed medically necessary. The treatment options including medical management, injection therapy, arthroscopy and arthroplasty were discussed at length. The risks and benefits of total hip arthroplasty were presented and reviewed. The risks due to aseptic loosening, infection, stiffness, dislocation/subluxation,  thromboembolic complications and other imponderables were discussed.  The patient acknowledged the explanation, agreed to proceed with the plan and consent was signed. Patient is being admitted for inpatient treatment for surgery, pain control,  PT, OT, prophylactic antibiotics, VTE prophylaxis, progressive ambulation and ADL's and discharge planning. The patient is planning to be discharged home with home health services

## 2015-02-08 NOTE — Op Note (Signed)
NAMERIVERS, HAMRICK NO.:  [id number]  MEDICAL RECORD NO.:  [id number]  LOCATION:  [facility]                         FACILITY:  [facility]  PHYSICIAN:  Samson Frederic, MD     DATE OF BIRTH:  April 30, 1960  DATE OF PROCEDURE:  02/08/2015 DATE OF DISCHARGE:                              OPERATIVE REPORT   SURGEON:  Samson Frederic, MD.  ASSISTANTS:  None.  PREOPERATIVE DIAGNOSIS:  Failed right total hip arthroplasty.  POSTOPERATIVE DIAGNOSIS:  Failed right total hip arthroplasty.  PROCEDURE PERFORMED:  Revision of right total hip arthroplasty with head ball and liner exchange.  EXPLANTS: 1. DePuy ceramic head ball 36 +5 mm. 2. Ceramic liner 60 x 36.  IMPLANTS: 1. Pinnacle Altrx liner, 36 x 60. 2. Biolox revision ceramic head ball with titanium sleeve 36 +8.5.  SPECIMENS: 1. Right hip explants to pathology. 2. Right hip pseudocapsule for tissue culture.  ANESTHESIA:  General.  EBL:  200 mL.  ANTIBIOTICS: 1. 2 g Ancef. 2. 1 g vancomycin.  COMPLICATIONS:  None.  DISPOSITION:  Stable to PACU.  INDICATIONS:  The patient is a 55 year old male, who had previously undergone right total hip arthroplasty anterior approach.  He had progressive pain as well as mechanical symptoms in the hip including giving away.  X-rays revealed ceramic debris within the hip.  Therefore, the diagnosis of fractured ceramic liner was made and he was then indicated for revision.  We discussed the risks, benefits, alternatives, he elected to proceed. He understands that the biggest risks include instability and infection.  DESCRIPTION OF PROCEDURE IN DETAIL:  The patient was identified in the holding area using 2 identifiers.  The surgical site was marked by myself.  He was taken to the operating room.  General anesthesia was induced on his bed.  He was then transferred to the [facility] table.  IV antibiotics were given within 60 minutes of beginning the procedure. The right hip was  prepped and draped in normal sterile surgical fashion. Time-out was called verifying side and site of surgery.    I used his previous incision, which I excised sharply with a 10 blade.   I extended his incision about 1 cm proximal and distal.  I carried dissection  To the fascia over the TFL.  I split the fascia of the TFL in line with fibers and then the Hueter interval was used.  He did have copious scar tissue within the surgical plane.  I made a standard T-shaped capsulotomy.  Upon entering the hip joint, there was no gross evidence of infection.  He did have significant amount of ceramic debris within the joint.  I used a bone hook to gently dislocate the hip.  I used a tamp to take the head off.  The ceramic head was completely intact.  The trunnion was intact.  His femoral stem was stable.  I incised his posterior capsule, made a small pocket to place the trunnion.  The hip was flexed, Mueller retractor was placed.  I did a 360 degree synovectomy around the periphery of the cup.  Sample of synovium was sent for tissue culture.  I continued removing ceramic debris as  I encountered it.  Once I had released around the cup, he had essentially fractured the rim of the ceramic liner from about 12 o'clock to about 7 o'clock.  Using an osteotome, I was able to loosen his ceramic liner which I removed.  This was passed to pathology as a specimen.  I removed all the ceramic debris that I was able to.  I irrigated the wound with saline.  The cup was stable. I then placed a real polyethylene liner 36  neutral.    I then turned my attention to the proximal femur.  I peeled the capsule  off the inner aspect of the trochanter.  I placed a trial head ball and Reduced the hip.  He had excellent stability.  Leg lengths were confirmed fluoroscopically.  I then placed a real ceramic head which was impacted into place.  The hip was reduced.  Repeat stability testing was performed.  At 90 degrees of  rotation and in full extension, the hip was stable.  Soft tissue tension was appropriate.  I copiously irrigated the wound with a dilute Betadine solution followed by normal saline with pulse lavage.  The fascia was closed with #1 and 0 V-Loc suture.  The deep fat was closed with 2-0 interrupted Vicryl.  Deep dermal layer was closed with 2-0 interrupted Monocryl.  Running 3-0 Monocryl subcuticular stitch was used.  Glue was applied to the skin.  Once the glue was fully hardened, an Aquacel Ag dressing was placed.  The patient was then transferred to his bed, extubated, and taken to PACU in stable condition.  Sponge, needle, and instrument counts were correct at the end of the case x2.  There were no known complications.  We will admit the patient to the hospital.  He will weight-bear as tolerated with a walker.  We will follow his culture.  He will be placed on aspirin for DVT prophylaxis.  He will work with physical therapy.  He may be discharged over the weekend.  All questions solicited and answered with his wife.          ______________________________ Samson Frederic, MD     BS/MEDQ  D:  02/08/2015  T:  02/08/2015  Job:  [id number]

## 2015-02-09 LAB — CBC
HCT: 34.9 % — ABNORMAL LOW (ref 39.0–52.0)
Hemoglobin: 12.2 g/dL — ABNORMAL LOW (ref 13.0–17.0)
MCH: 29.9 pg (ref 26.0–34.0)
MCHC: 35 g/dL (ref 30.0–36.0)
MCV: 85.5 fL (ref 78.0–100.0)
PLATELETS: 129 10*3/uL — AB (ref 150–400)
RBC: 4.08 MIL/uL — AB (ref 4.22–5.81)
RDW: 12.8 % (ref 11.5–15.5)
WBC: 11.7 10*3/uL — ABNORMAL HIGH (ref 4.0–10.5)

## 2015-02-09 LAB — BASIC METABOLIC PANEL
Anion gap: 7 (ref 5–15)
BUN: 13 mg/dL (ref 6–20)
CALCIUM: 8.4 mg/dL — AB (ref 8.9–10.3)
CO2: 24 mmol/L (ref 22–32)
CREATININE: 0.88 mg/dL (ref 0.61–1.24)
Chloride: 109 mmol/L (ref 101–111)
GFR calc Af Amer: >60 mL/min (ref >60)
GLUCOSE: 151 mg/dL — AB (ref 65–99)
Potassium: 3.8 mmol/L (ref 3.5–5.1)
SODIUM: 140 mmol/L (ref 135–145)

## 2015-02-09 MED ORDER — ONDANSETRON HCL 4 MG PO TABS: 4.0000 mg | ORAL_TABLET | Freq: Four times a day (QID) | ORAL | Status: AC | PRN

## 2015-02-09 MED ORDER — DOCUSATE SODIUM 100 MG PO CAPS: 100.0000 mg | ORAL_CAPSULE | Freq: Two times a day (BID) | ORAL | Status: AC

## 2015-02-09 MED ORDER — HYDROCODONE-ACETAMINOPHEN 5-325 MG PO TABS: 1.0000 | ORAL_TABLET | ORAL | Status: AC | PRN

## 2015-02-09 MED ORDER — SENNA 8.6 MG PO TABS: 2.0000 | ORAL_TABLET | Freq: Every day | ORAL | Status: AC

## 2015-02-09 MED ORDER — ASPIRIN 325 MG PO TBEC: 325.0000 mg | DELAYED_RELEASE_TABLET | Freq: Two times a day (BID) | ORAL | Status: AC

## 2015-02-09 NOTE — Care Management Note (Addendum)
Case Management Note  Patient Details  Name: Larry Brewer MRN: [id number] Date of Birth: 1960-03-07  Subjective/Objective:                  REVISION RIGHT TOTAL HIP   Action/Plan: CM spoke with patient at the bedside. Patient would like [facility] for HHPT. CM contacted [facility] in [place], [place] and notified Missy of the referral. CM will faxed the order, facesheet and H&P to [facility] at [phone]. Patient needs a RW. Jermaine at [facility] notified of the DME request and discharge date of today.   Expected Discharge Date:    02/09/15              Expected Discharge Plan:  Home w Home Health Services  In-House Referral:     Discharge planning Services  CM Consult  Post Acute Care Choice:  Home Health Choice offered to:     DME Arranged:  Walker rolling DME Agency:  [facility]  HH Arranged:  PT [facility] Agency:  [facility]  Status of Service:  Completed, signed off  Medicare Important Message Given:    Date Medicare IM Given:    Medicare IM give by:    Date Additional Medicare IM Given:    Additional Medicare Important Message give by:     If discussed at Long Length of Stay Meetings, dates discussed:    Additional Comments:  Antony Haste, RN 02/09/2015, 10:10 AM

## 2015-02-09 NOTE — Progress Notes (Signed)
OT Cancellation Note  Patient Details Name: Larry Brewer MRN: [id number] DOB: 1960/08/15   Cancelled Treatment:    Reason Eval/Treat Not Completed: OT screened, no needs identified, will sign off  Keiran Gaffey, [place] Kung 02/09/2015, 9:51 AM

## 2015-02-09 NOTE — Discharge Summary (Signed)
Physician Discharge Summary  Patient ID: Larry Brewer MRN: [id number] DOB/AGE: 06/07/1960 55 y.o.  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 Complication of anesthesia     severe headache following dental procedure approx 40 years ago  . History of kidney stones   . Arthritis   . Wears glasses     Surgeries: Procedure(s):  RIGHT TOTAL HIP REVISION HEAD AND LINER EXCHANGE on 02/08/2015   Consultants (if any):    Discharged Condition: Improved  Hospital Course: Larry Brewer is an 55 y.o. male who was admitted 02/08/2015 with a diagnosis of Failed total hip arthroplasty (HCC) and went to the operating room on 02/08/2015 and underwent the above named procedures.    He was given perioperative antibiotics:      Anti-infectives    Start     Dose/Rate Route Frequency Ordered Stop   02/08/15 1400  ceFAZolin (ANCEF) IVPB 2 g/50 mL premix     2 g 100 mL/hr over 30 Minutes Intravenous Every 6 hours 02/08/15 1215 02/08/15 2044   02/08/15 0830  vancomycin (VANCOCIN) 1,000 mg in sodium chloride 0.9 % 250 mL IVPB     1,000 mg 250 mL/hr over 60 Minutes Intravenous  Once 02/08/15 0818 02/08/15 0915   02/08/15 0555  ceFAZolin (ANCEF) IVPB 2 g/50 mL premix     2 g 100 mL/hr over 30 Minutes Intravenous On call to O.R. 02/08/15 0555 02/08/15 0800    .  He was given sequential compression devices, early ambulation, and ASA for DVT prophylaxis.  He benefited maximally from the hospital stay and there were no complications.    Recent vital signs:  Filed Vitals:   02/08/15 2125 02/09/15 0534  BP: 111/67 110/57  Pulse: 63 50  Temp: 97.8 F (36.6 C) 97.9 F (36.6 C)  Resp: 16 16    Recent laboratory studies:  Lab Results  Component Value Date   HGB 12.2* 02/09/2015   HGB 15.1 01/31/2015   Lab Results   Component Value Date   WBC 11.7* 02/09/2015   PLT 129* 02/09/2015   Lab Results  Component Value Date   INR 1.06 01/31/2015   Lab Results  Component Value Date   NA 140 02/09/2015   K 3.8 02/09/2015   CL 109 02/09/2015   CO2 24 02/09/2015   BUN 13 02/09/2015   CREATININE 0.88 02/09/2015   GLUCOSE 151* 02/09/2015    Discharge Medications:     Medication List    TAKE these medications        aspirin EC 325 MG tablet  Take 1 tablet (325 mg total) by mouth 2 (two) times daily after a meal.     aspirin 325 MG EC tablet  Take 1 tablet (325 mg total) by mouth 2 (two) times daily after a meal.     atorvastatin 10 MG tablet  Commonly known as:  LIPITOR  Take 10 mg by mouth daily.     ciclopirox 0.77 % cream  Commonly known as:  LOPROX  Apply 1 application topically 2 (two) times daily. Applied to forehead.     docusate sodium 100 MG capsule  Commonly known as:  COLACE  Take 1 capsule (100 mg total) by mouth 2 (two) times daily.     docusate sodium 100 MG capsule  Commonly known as:  COLACE  Take 1 capsule (100 mg total) by mouth 2 (two) times daily.     FLUVIRIN 0.5 ML Susy  Generic drug:  Influenza Vac Typ A&B Surf Ant  ADM 0.5ML IM UTD     HYDROcodone-acetaminophen 5-325 MG tablet  Commonly known as:  NORCO  Take 1-2 tablets by mouth every 4 (four) hours as needed for moderate pain.     HYDROcodone-acetaminophen 5-325 MG tablet  Commonly known as:  NORCO/VICODIN  Take 1-2 tablets by mouth every 4 (four) hours as needed (breakthrough pain).     ketoconazole 2 % shampoo  Commonly known as:  NIZORAL  Apply 1 application topically daily.     ondansetron 4 MG tablet  Commonly known as:  ZOFRAN  Take 1 tablet (4 mg total) by mouth every 8 (eight) hours as needed for nausea or vomiting.     ondansetron 4 MG tablet  Commonly known as:  ZOFRAN  Take 1 tablet (4 mg total) by mouth every 6 (six) hours as needed for nausea.     senna 8.6 MG Tabs tablet  Commonly  known as:  SENOKOT  Take 2 tablets (17.2 mg total) by mouth at bedtime.     senna 8.6 MG Tabs tablet  Commonly known as:  SENOKOT  Take 2 tablets (17.2 mg total) by mouth at bedtime.        Diagnostic Studies: Dg Pelvis Portable  02/08/2015  CLINICAL DATA:  Status post right total hip revision. EXAM: PORTABLE PELVIS 1-2 VIEWS COMPARISON:  Same day. FINDINGS: Status post bilateral total hip arthroplasties. The femoral and acetabular components appear to be well situated bilaterally. No fracture or dislocation is noted. Expected postoperative changes are seen in the soft tissues around the right hip. IMPRESSION: Bilateral hip arthroplasties are noted. They appear to be well situated. These results will be called to the ordering clinician or representative by the Radiologist Assistant, and communication documented in the PACS or zVision Dashboard. Electronically Signed   By: Lupita Raider, M.D.   On: 02/08/2015 12:14   Dg C-arm 1-60 Min-no Report  02/08/2015  CLINICAL DATA: surgery C-ARM 1-60 MINUTES Fluoroscopy was utilized by the requesting physician.  No radiographic interpretation.   Dg Hip Operative Unilat With Pelvis Right  02/08/2015  CLINICAL DATA:  Hip surgery EXAM: OPERATIVE RIGHT HIP (WITH PELVIS IF PERFORMED) 1 VIEWS TECHNIQUE: Fluoroscopic spot image(s) were submitted for interpretation post-operatively. COMPARISON:  None. FINDINGS: Right total hip arthroplasty is anatomically aligned based on a single AP view. No breakage or loosening of the hardware. IMPRESSION: Right total hip arthroplasty is anatomically aligned. Electronically Signed   By: Jolaine Click M.D.   On: 02/08/2015 11:23    Disposition: Final discharge disposition not confirmed    Follow-up Information    Follow up with [facility] Elbert, Cloyde Reams, MD. Schedule an appointment as soon as possible for a visit in 2 weeks.   Specialty:  Orthopedic Surgery   Why:  For wound re-check   Contact information:   [address] [place] [address] [phone]       Follow up with [facility].   Why:  home health physical therapy   Contact information:   [address] [place], [place] [address] [phone]       Signed: Garnet Koyanagi 02/09/2015, 12:20 PM

## 2015-02-09 NOTE — Progress Notes (Signed)
Physical Therapy Treatment Patient Details Name: Larry Brewer MRN: [id number] DOB: [birth year] Today's Date: 02/09/2015    History of Present Illness R THR revision (liner and ball exchange); S/p bil THR - anterior direct    PT Comments    POD # 1 am session Pt OOB in recliner with 2/10 R hip pain.  Assisted with amb in hallway with walker. Session shortened by breakfast arrival.  Will return to practice one step and perform/educate on HEP.   Follow Up Recommendations  Home health PT     Equipment Recommendations  Rolling walker with 5" wheels    Recommendations for Other Services       Precautions / Restrictions Precautions Precautions: Fall Restrictions Weight Bearing Restrictions: No Other Position/Activity Restrictions: WBAT    Mobility  Bed Mobility               General bed mobility comments: Pt OOB in recliner  Transfers Overall transfer level: Needs assistance Equipment used: Rolling walker (2 wheeled) Transfers: Sit to/from Stand Sit to Stand: Supervision         General transfer comment: one VC safety with turns using walker  Ambulation/Gait Ambulation/Gait assistance: Supervision Ambulation Distance (Feet): 225 Feet Assistive device: Rolling walker (2 wheeled) Gait Pattern/deviations: Step-through pattern     General Gait Details: one VC safety with backward gait using walker   Stairs            Wheelchair Mobility    Modified Rankin (Stroke Patients Only)       Balance                                    Cognition Arousal/Alertness: Awake/alert Behavior During Therapy: WFL for tasks assessed/performed Overall Cognitive Status: Within Functional Limits for tasks assessed                      Exercises      General Comments        Pertinent Vitals/Pain Pain Assessment: 0-10 Pain Score: 2  Pain Location: R hip Pain Descriptors / Indicators: Tightness Pain Intervention(s): Monitored during  session;Premedicated before session;Repositioned;Ice applied    Home Living                      Prior Function            PT Goals (current goals can now be found in the care plan section) Progress towards PT goals: Progressing toward goals    Frequency  7X/week    PT Plan Current plan remains appropriate    Co-evaluation             End of Session Equipment Utilized During Treatment: Gait belt Activity Tolerance: Patient tolerated treatment well Patient left: in chair;with call bell/phone within reach;with family/visitor present     Time: 8756-4332 PT Time Calculation (min) (ACUTE ONLY): 12 min  Charges:  $Gait Training: 8-22 mins                    G Codes:      Felecia Shelling  PTA [facility]  Acute  Rehab Pager      [phone]

## 2015-02-09 NOTE — Progress Notes (Signed)
Physical Therapy Treatment Patient Details Name: Larry Brewer MRN: [id number] DOB: [birth year] Today's Date: 02/09/2015    History of Present Illness R THR revision (liner and ball exchange); S/p bil THR - anterior direct    PT Comments    POD # 1 pm session Assisted with amb in hallway, practiced one step twice and returned to room and performed all TE's following HEP handout.  Instructed on freq and proper tech plus the use of ICE after.  All mobility questions addressed. Pt ready for D/C to home.   Follow Up Recommendations  Home health PT     Equipment Recommendations  Rolling walker with 5" wheels    Recommendations for Other Services       Precautions / Restrictions Precautions Precautions: Fall Restrictions Weight Bearing Restrictions: No Other Position/Activity Restrictions: WBAT    Mobility  Bed Mobility               General bed mobility comments: Pt OOB in recliner  Transfers Overall transfer level: Needs assistance Equipment used: Rolling walker (2 wheeled) Transfers: Sit to/from Stand Sit to Stand: Supervision         General transfer comment: one VC safety with turns using walker  Ambulation/Gait Ambulation/Gait assistance: Supervision Ambulation Distance (Feet): 225 Feet Assistive device: Rolling walker (2 wheeled) Gait Pattern/deviations: Step-through pattern     General Gait Details: one VC safety with backward gait using walker   Stairs Stairs: Yes Stairs assistance: Min guard Stair Management: No rails;Step to pattern;Forwards;With walker Number of Stairs: 1 General stair comments: performed twice with one initial VC on proper walker placement and proper sequencing.    Wheelchair Mobility    Modified Rankin (Stroke Patients Only)       Balance                                    Cognition Arousal/Alertness: Awake/alert Behavior During Therapy: WFL for tasks assessed/performed Overall Cognitive Status:  Within Functional Limits for tasks assessed                      Exercises    Hip  TE's 10 reps ankle pumps 10 reps knee presses 10 reps heel slides 10 reps SAQ's 10 reps ABD Followed by ICE     General Comments        Pertinent Vitals/Pain Pain Assessment: 0-10 Pain Score: 2  Pain Location: R hip Pain Descriptors / Indicators: Tightness Pain Intervention(s): Monitored during session;Premedicated before session;Repositioned;Ice applied    Home Living                      Prior Function            PT Goals (current goals can now be found in the care plan section) Progress towards PT goals: Progressing toward goals    Frequency  7X/week    PT Plan Current plan remains appropriate    Co-evaluation             End of Session Equipment Utilized During Treatment: Gait belt Activity Tolerance: Patient tolerated treatment well Patient left: in chair;with call bell/phone within reach;with family/visitor present     Time: 1040-1105 PT Time Calculation (min) (ACUTE ONLY): 25 min  Charges:  $Gait Training: 8-22 mins $Therapeutic Exercise: 8-22 mins  G Codes:      Larry Brewer  PTA [facility]  Acute  Rehab Pager      [phone]

## 2015-02-09 NOTE — Progress Notes (Signed)
Patient ID: Larry Brewer, male   DOB: December 17, 1960, 55 y.o.   MRN: [id number]    Subjective: 1 Day Post-Op Procedure(s) (LRB):  RIGHT TOTAL HIP REVISION HEAD AND LINER EXCHANGE (Right) Patient reports pain as 0 on 0-10 scale.   Denies CP or SOB.  Voiding without difficulty. Positive flatus. Objective: Vital signs in last 24 hours: Temp:  [97.3 F (36.3 C)-98.2 F (36.8 C)] 97.9 F (36.6 C) (02/04 0534) Pulse Rate:  [50-73] 50 (02/04 0534) Resp:  [11-16] 16 (02/04 0534) BP: (110-136)/(57-75) 110/57 mmHg (02/04 0534) SpO2:  [97 %-100 %] 97 % (02/04 0534)  Intake/Output from previous day: 02/03 0701 - 02/04 0700 In: 5055.4 [P.O.:1200; I.V.:3755.4; IV Piggyback:100] Out: 2150 [Urine:1950; Blood:200] Intake/Output this shift:    Labs:  Recent Labs  02/09/15 0454  HGB 12.2*    Recent Labs  02/09/15 0454  WBC 11.7*  RBC 4.08*  HCT 34.9*  PLT 129*    Recent Labs  02/09/15 0454  NA 140  K 3.8  CL 109  CO2 24  BUN 13  CREATININE 0.88  GLUCOSE 151*  CALCIUM 8.4*   No results for input(s): LABPT, INR in the last 72 hours.  Physical Exam: Neurologically intact ABD soft Sensation intact distally Dorsiflexion/Plantar flexion intact Incision: no drainage Compartment soft  Assessment/Plan: 1 Day Post-Op Procedure(s) (LRB):  RIGHT TOTAL HIP REVISION HEAD AND LINER EXCHANGE (Right) Discharge home with home health - after cleared by PT Pt needs a walker that is appropriate for his height to take home   [place], Baxter Kail for Dr. Venita Lick [facility] [phone] 02/09/2015, 9:48 AM

## 2015-02-11 LAB — TISSUE CULTURE: Culture: NO GROWTH

## 2015-02-13 LAB — ANAEROBIC CULTURE

## 2016-08-23 IMAGING — DX DG PORTABLE PELVIS
2 series · 2 of 2 positions shown · non-contrast
Comparison: Same day.

CLINICAL DATA: Status post right total hip revision.

EXAM:
PORTABLE PELVIS 1-2 VIEWS

[pelvis ap (1 of 2)]
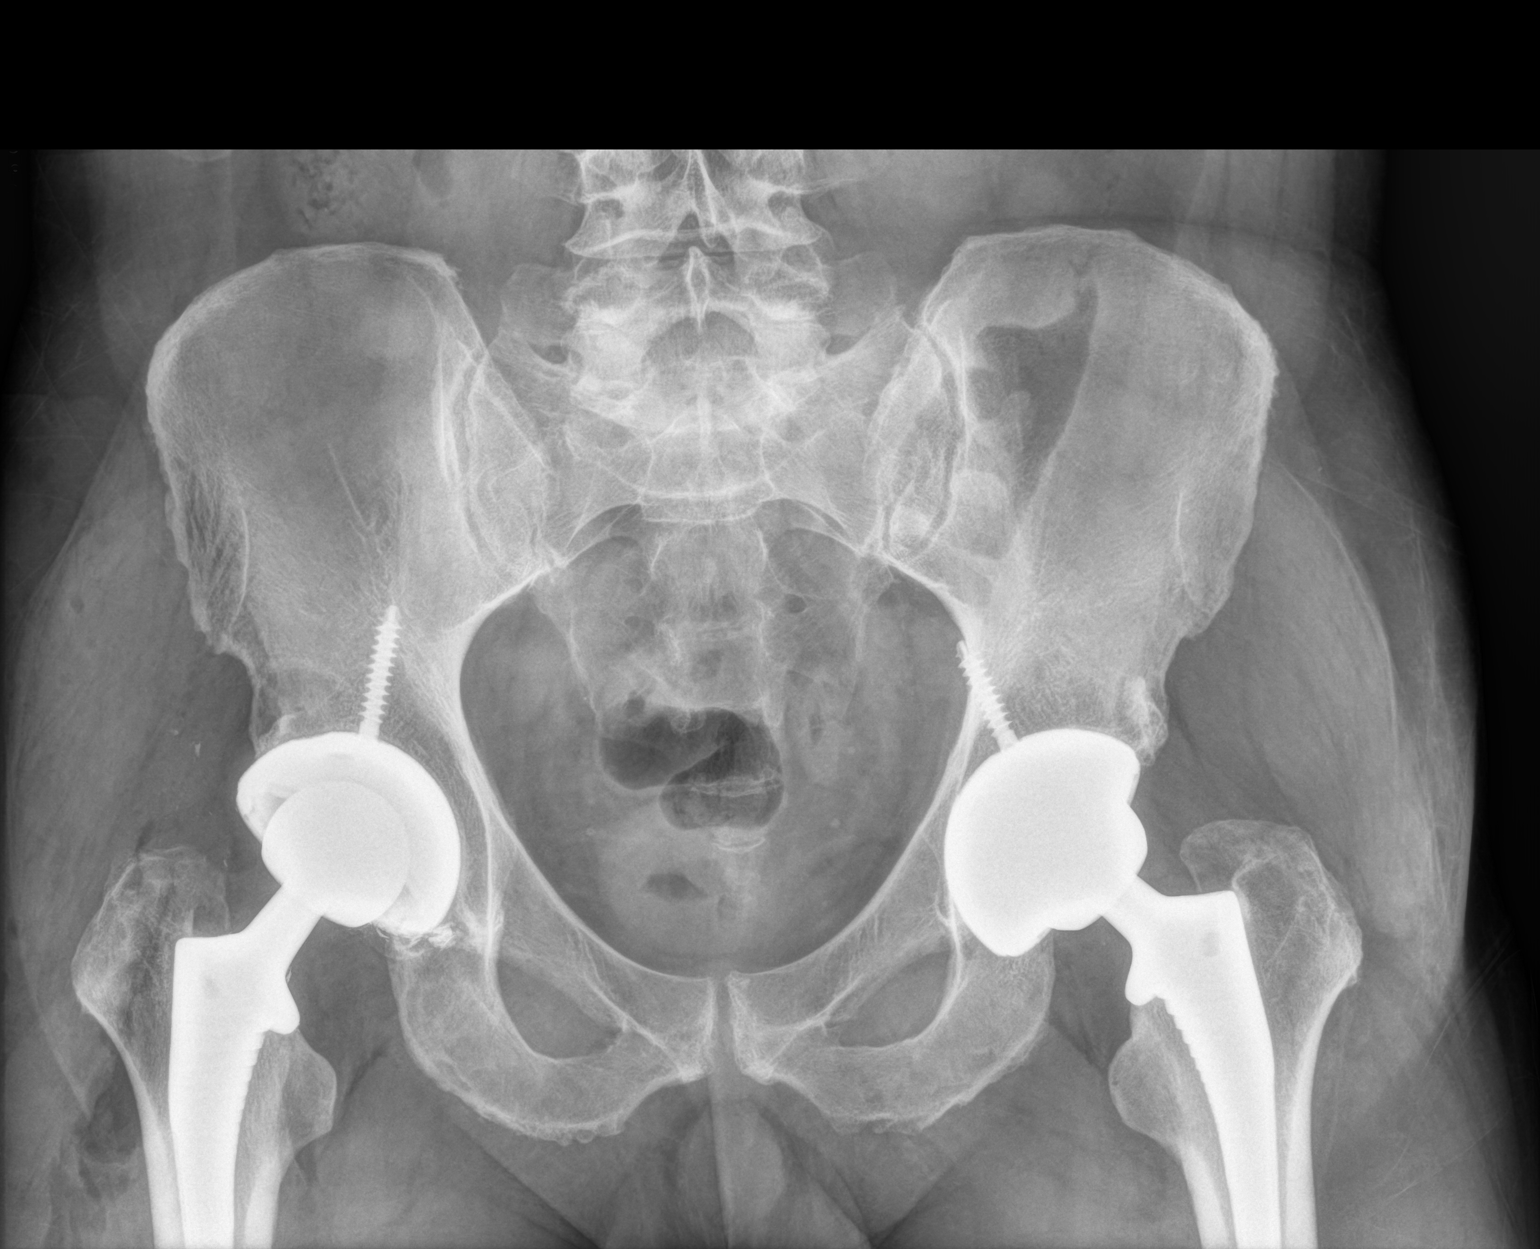

[pelvis ap (2 of 2)]
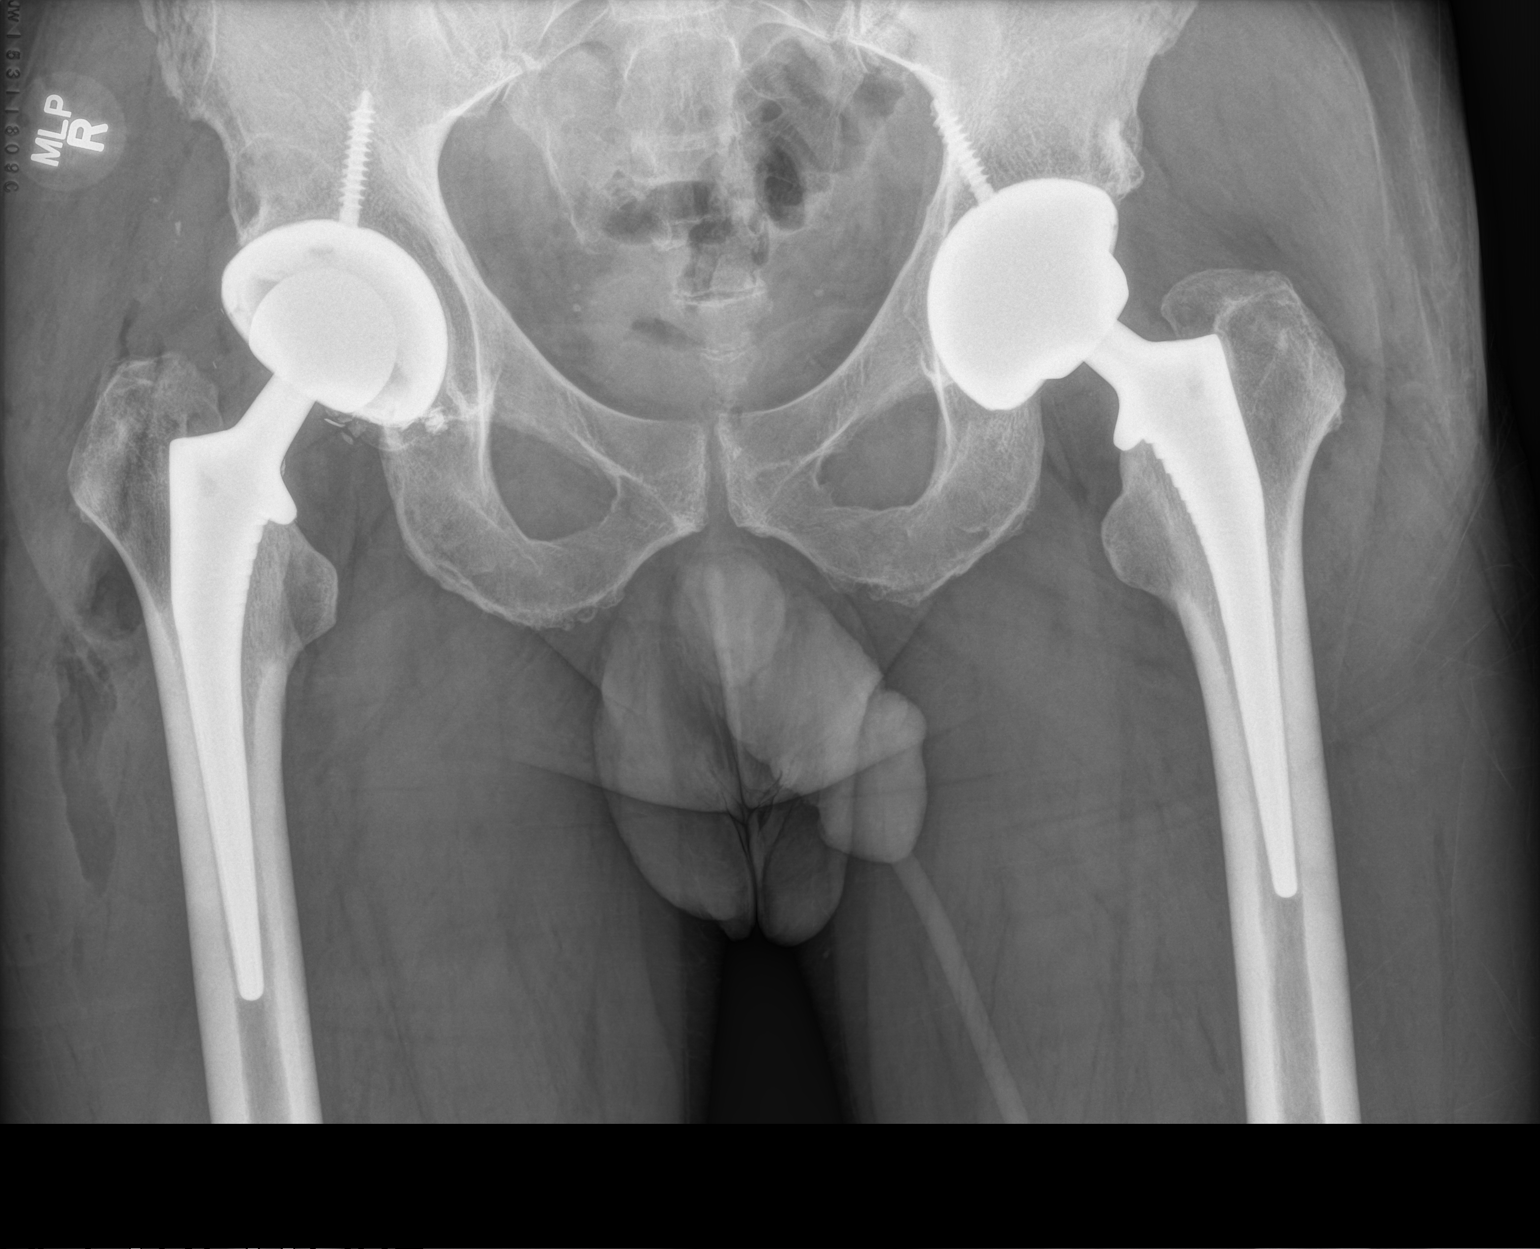

[2 of 2 positions shown; findings below may reference images not displayed]

FINDINGS: Status post bilateral total hip arthroplasties. The femoral and
acetabular components appear to be well situated bilaterally. No
fracture or dislocation is noted. Expected postoperative changes are
seen in the soft tissues around the right hip.
IMPRESSION: Bilateral hip arthroplasties are noted. They appear to be well
situated. These results will be called to the ordering clinician or
representative by the Radiologist Assistant, and communication
documented in the PACS or zVision Dashboard.
